# Patient Record
Sex: Male | Born: 1995 | Race: White | Hispanic: No | Marital: Single | Smoking: Never smoker
Health system: Southern US, Community
[De-identification: ages and names within clinical notes are randomized; demographics above are authoritative.]

---
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
## 2014-01-13 ENCOUNTER — Encounter: Payer: Self-pay | Admitting: Sports Medicine

## 2014-01-13 ENCOUNTER — Ambulatory Visit (INDEPENDENT_AMBULATORY_CARE_PROVIDER_SITE_OTHER): Payer: BC Managed Care – PPO | Admitting: Sports Medicine

## 2014-01-13 VITALS — BP 123/71 | HR 71 | Ht 71.5 in | Wt 145.0 lb

## 2014-01-13 DIAGNOSIS — L700 Acne vulgaris: Secondary | ICD-10-CM

## 2014-01-13 DIAGNOSIS — Z23 Encounter for immunization: Secondary | ICD-10-CM

## 2014-01-13 DIAGNOSIS — W57XXXA Bitten or stung by nonvenomous insect and other nonvenomous arthropods, initial encounter: Secondary | ICD-10-CM | POA: Insufficient documentation

## 2014-01-13 DIAGNOSIS — Z0000 Encounter for general adult medical examination without abnormal findings: Secondary | ICD-10-CM

## 2014-01-13 DIAGNOSIS — Z299 Encounter for prophylactic measures, unspecified: Secondary | ICD-10-CM | POA: Insufficient documentation

## 2014-01-13 DIAGNOSIS — L708 Other acne: Secondary | ICD-10-CM

## 2014-01-13 DIAGNOSIS — R41840 Attention and concentration deficit: Secondary | ICD-10-CM

## 2014-01-13 NOTE — Assessment & Plan Note (Signed)
ADHD IV rating scale given. He will also check with [employer] to see if they have an in-house evaluation. If not, I am happy to do a referral to Dr. Dellia CloudGutterman for consideration of ADHD testing. 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

## 2014-01-13 NOTE — Progress Notes (Signed)
  Subjective:    CC: Complete physical  HPI:  Mike Christensen is a healthy 18 year old male.  Acne: Well controlled with minocycline.  Poor concentration: we discussed this at prior visits, he is now thinking more strongly about seeing a psychologist for consideration of ADHD testing.  Tick bite: in the room he felt something crawling on his neck, on further inspection there was a tick, non-engorged, on his posterior neck. No rash.  Past medical history, Surgical history, Family history not pertinant except as noted below, Social history, Allergies, and medications have been entered into the medical record, reviewed, and no changes needed.   Review of Systems: No headache, visual changes, nausea, vomiting, diarrhea, constipation, dizziness, abdominal pain, skin rash, fevers, chills, night sweats, swollen lymph nodes, weight loss, chest pain, body aches, joint swelling, muscle aches, shortness of breath, mood changes, visual or auditory hallucinations.  Objective:    General: Well Developed, well nourished, and in no acute distress.  Neuro: Alert and oriented x3, extra-ocular muscles intact, sensation grossly intact.  HEENT: Normocephalic, atraumatic, pupils equal round reactive to light, neck supple, no masses, no lymphadenopathy, thyroid nonpalpable.  Skin: Warm and dry, no rashes noted. A small deer tick was noted on his neck, this was removed with forceps including the head. Cardiac: Regular rate and rhythm, no murmurs rubs or gallops.  Respiratory: Clear to auscultation bilaterally. Not using accessory muscles, speaking in full sentences.  Abdominal: Soft, nontender, nondistended, positive bowel sounds, no masses, no organomegaly.  Musculoskeletal: Shoulder, elbow, wrist, hip, knee, ankle stable, and with full range of motion.  Impression and Recommendations:    The patient was counselled, risk factors were discussed, anticipatory guidance given.

## 2014-01-13 NOTE — Assessment & Plan Note (Signed)
Overall well controlled with minocycline, I did tell him we can alternate minocycline and doxycycline as needed.

## 2014-01-13 NOTE — Assessment & Plan Note (Signed)
Complete physical as above. Meningococcal booster given.

## 2014-01-13 NOTE — Assessment & Plan Note (Addendum)
Small deer tick noted on his neck, it was not engorged, was removed with forceps including head. He was asymptomatic, and tick was likely not attached long enough to consider tick borne serologies.

## 2014-02-27 ENCOUNTER — Telehealth: Payer: Self-pay

## 2014-02-27 NOTE — Telephone Encounter (Signed)
Spoke to patient he stated that he is on the Minocycline now and it worked for 2 weeks and it is not working he stated that it is worst on his face and back. He wants something topical. Patient is in college now so his mom will call back with the pharmacy information. Rhonda Cunningham,CMA

## 2014-02-27 NOTE — Telephone Encounter (Signed)
Mother called stated that acne medication is not working for patient she is requesting something else. Rhonda Cunningham,CMA

## 2014-02-27 NOTE — Telephone Encounter (Signed)
Awaiting call back with pharmacy information. We will probably use tretinoin

## 2014-02-27 NOTE — Telephone Encounter (Signed)
Minocycline was effective before, does he want something else topical or something oral, where as the acne the worst, face, chest, back?

## 2014-03-02 NOTE — Telephone Encounter (Signed)
Called patient to see which pharmacy he wanted to use and patient is still unsure as to what pharmacy he wants to use. Mike Christensen,CMA

## 2014-03-03 NOTE — Telephone Encounter (Signed)
Called patient again today to find out which pharmacy he wanted his medication sent to still unable to reach patient. Maree Ainley,CMA

## 2014-07-22 ENCOUNTER — Encounter: Payer: Self-pay | Admitting: Sports Medicine

## 2014-07-22 ENCOUNTER — Ambulatory Visit (INDEPENDENT_AMBULATORY_CARE_PROVIDER_SITE_OTHER): Payer: BLUE CROSS/BLUE SHIELD | Admitting: Sports Medicine

## 2014-07-22 VITALS — BP 110/68 | HR 60 | Ht 71.0 in | Wt 149.0 lb

## 2014-07-22 DIAGNOSIS — IMO0001 Reserved for inherently not codable concepts without codable children: Secondary | ICD-10-CM

## 2014-07-22 DIAGNOSIS — S93331A Other subluxation of right foot, initial encounter: Secondary | ICD-10-CM | POA: Insufficient documentation

## 2014-07-22 DIAGNOSIS — S9304XA Dislocation of right ankle joint, initial encounter: Secondary | ICD-10-CM | POA: Diagnosis not present

## 2014-07-22 NOTE — Progress Notes (Signed)
  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 He had immediate pain, swelling, bruising, this improved however he continued to have a popping sensation that he localizes over the lateral aspect of his ankle. He also had pain that he localizes over the lateral talocrural joint. Currently he's doing well however with any active or passive plantar flexion and eversion he notes a pop and pain area symptoms are mild, persistent. [facility], they obtained x-rays which were negative. He does wear a brace while wearing his hockey skate, this helps somewhat.  Past medical history, Surgical history, Family history not pertinant except as noted below, Social history, Allergies, and medications have been entered into the medical record, reviewed, and no changes needed.   Review of Systems: No fevers, chills, night sweats, weight loss, chest pain, or shortness of breath.   Objective:    General: Well Developed, well nourished, and in no acute distress.  Neuro: Alert and oriented x3, extra-ocular muscles intact, sensation grossly intact.  HEENT: Normocephalic, atraumatic, pupils equal round reactive to light, neck supple, no masses, no lymphadenopathy, thyroid nonpalpable.  Skin: Warm and dry, no rashes. Cardiac: Regular rate and rhythm, no murmurs rubs or gallops, no lower extremity edema.  Respiratory: Clear to auscultation bilaterally. Not using accessory muscles, speaking in full sentences. Right Ankle: No visible erythema or swelling. Range of motion is full in all directions. Strength is 5/5 in all directions. Stable lateral and medial ligaments; squeeze test and kleiger test unremarkable; Minimally tender to palpation at the lateral talar dome. No pain at base of 5th MT; No tenderness over cuboid; No tenderness over N spot or navicular prominence No  tenderness on posterior aspects of lateral and medial malleolus Plantarflexion and eversion does reproduce peroneal tendon subluxation. Negative tarsal tunnel tinel's Able to walk 4 steps.  Impression and Recommendations:

## 2014-07-22 NOTE — Patient Instructions (Signed)
Peroneal Tendon Subluxation and Dislocation with Rehab The two peroneal tendons are located on the outer side of the back of the ankle, and connect the muscles that allow you to stand on your tip toes. These tendons are susceptible to two similar injuries known as a dislocation or a subluxation. A dislocation occurs when one or both of the tendons is out of alignment and slips completely out of the groove where they normally rest. A subluxation is a lesser but similar injury in which one or both of the tendons slide in and out of normal alignment. The tendons are normally held into place by a ligament-like structure (retinaculum); however, when this structure is injured dislocations and subluxations are common. SYMPTOMS   A "pop" or "snap" heard or felt at the time of injury.  Pain, swelling, tenderness, and bruising (contusion) at the injury site, behind the outer ankle, that initially is worsened by standing or walking.  Pain and weakness when trying to push the foot toward the injured side.  Often no problems (asymptomatic) when walking, but symptomatic when trying to cut or pivot on the injured leg, moving toward the side of injury.  A crackling sound (crepitation ) when the tendon is moved or touched.  Numbness or paralysis below the dislocation from pinching, cutting, or pressure on the blood vessels or nerves (uncommon). CAUSES  Peroneal tendon dislocations and subluxations are caused by a force placed on the tendon(s) that is strong enough to displace one or both of them from their normal alignment. Common mechanisms of injury include:  A sudden stressful incident, such as forceful bend to an ankle (most often outwardly turned) against resistance by the peroneal muscles.  Severe ankle sprain.  You are born with it (congenital abnormality), such as a shallow or malformed groove for the tendons. RISK INCREASES WITH:  Snow skiing or ice skating.  Jumping sports (basketball,  gymnastics, or volleyball).  Sports in which pivoting is important (football, soccer, or lacrosse).  Exercise on uneven terrain or surfaces.  Previous foot or ankle injury.  Poor strength and flexibility. PREVENTION  Warm up and stretch properly before activity.  Maintain physical fitness:  Strength, flexibility, and endurance.  Cardiovascular fitness.  Protect the vulnerable ankle with supportive devices, such as wrapped elastic bandages, tape, braces, or high-top athletic shoes.  Avoid irregular surfaces for running or other activities.  Complete the full rehabilitation course after an ankle injury before return to practice or competition. PROGNOSIS  If treated properly, then peroneal dislocations and subluxations can be expected to heal. RELATED COMPLICATIONS   Chronic pain and disability and recurrent dislocation if activity is resumed too soon.  Rupture of the tendon due to recurrent subluxation or dislocation.  Unstable or arthritic joint following repeated injury or delayed treatment. TREATMENT   Acute dislocations and subluxations: Treatment initially involves the use of ice and medication to help reduce pain and inflammation. The use of compression bandages and elevating the foot above the level of the heart will also help reduce inflammation. A controversy exists about whether to treat these injuries with surgery or non-surgical (conservative) measures. The conservative method is to immobilize the foot and ankle for up to 6 weeks. Surgical treatment requires early intervention to repair the injured retinaculum, followed by immobilization. After immobilization it is important to perform strengthening and stretching exercises to help regain strength and a full range of motion. These exercises may be completed at home or with a therapist.  Chronic dislocations and subluxations: Treatment initially  involves the use of ice and medication to help reduce pain and inflammation.  Surgery is recommended to hold the peroneal tendons in the groove where they are suppose to be located. Surgery is followed by immobilization of the foot and ankle. After immobilization it is important to perform strengthening and stretching exercises to help regain strength and a full range of motion. These exercises may be completed at home or with a therapist. MEDICATION   If pain medication is necessary, then nonsteroidal anti-inflammatory medications, such as aspirin and ibuprofen, or other minor pain relievers, such as acetaminophen, are often recommended.  Do not take pain medication for 7 days before surgery.  Prescription pain relievers may be given if deemed necessary by your caregiver. Use only as directed and only as much as you need. COLD THERAPY  Cold treatment (icing) relieves pain and reduces inflammation. Cold treatment should be applied for 10 to 15 minutes every 2 to 3 hours for inflammation and pain and immediately after any activity that aggravates your symptoms. Use ice packs or massage the area with a piece of ice (ice massage). SEEK MEDICAL CARE IF:   Pain, tenderness, or swelling worsens despite treatment.  You experience pain, numbness, or coldness or a blue, gray or dark color appears in the toenails.  Any of the following occur after surgery: signs of infection, including fever, increased pain, swelling, redness, drainage, or bleeding in the surgical area.  New, unexplained symptoms develop (drugs used in treatment may produce side effects). ExitCare Patient Information 2015 [place]. This information is not intended to replace advice given to you by your health care provider. Make sure you discuss any questions you have with your health care provider.

## 2014-07-22 NOTE — Assessment & Plan Note (Signed)
This occurred after an inversion injury several months ago. Mike Christensen continues to have pain with passive and active plantar flexion and eversion of the foot with a palpable pop behind the lateral malleolus. There is also some pain at the lateral talar. He was seen by student health where x-rays were shown to be negative. Unfortunately pain is continuous. CAM boot, [facility]. I am going to allow him to play hockey as long as he keeps a brace on and picks up his foot during slap shots. Return to see me in one month, MRI if no better.

## 2015-01-11 ENCOUNTER — Telehealth: Payer: Self-pay

## 2015-01-11 NOTE — Telephone Encounter (Signed)
Yeah he just needs another one.  Paperwork has to be done etc..

## 2015-01-11 NOTE — Telephone Encounter (Signed)
Pt's mother called stating that Shaquill's boot cracked and she wants to know how to go about exchanging it or getting another one. She says that she contacted [facility] and they told her to take it up with the doctor. Please advise.

## 2015-01-12 ENCOUNTER — Ambulatory Visit: Payer: BLUE CROSS/BLUE SHIELD | Admitting: Sports Medicine

## 2015-01-12 VITALS — BP 130/80 | HR 62

## 2015-01-12 DIAGNOSIS — Z111 Encounter for screening for respiratory tuberculosis: Secondary | ICD-10-CM | POA: Insufficient documentation

## 2015-01-12 NOTE — Assessment & Plan Note (Signed)
Quantiferon Gold TB screening test. This will take place of a PPD.

## 2015-01-12 NOTE — Progress Notes (Signed)
Patient came into clinic for PPD placement. Pt works at an [employer] office and they require it. Prior to administration of PPD test, it was determined the Pt would not be able to come back for his PPD read in the appropriate time frame. Spoke with Dr. Benjamin Stainhekkekandam, blood test was ordered. Pt was given his lab work papers and went down to get his blood drawn. Advised we will contact him with his results and he can come by and get a printed copy for his employer. Verbalized understanding, no further questions.

## 2015-01-12 NOTE — Telephone Encounter (Signed)
Pt's mother advised. 

## 2015-02-10 ENCOUNTER — Telehealth: Payer: Self-pay

## 2015-02-10 DIAGNOSIS — R41840 Attention and concentration deficit: Secondary | ICD-10-CM

## 2015-02-10 NOTE — Telephone Encounter (Signed)
Referral placed to Dr. Dellia Cloud.

## 2015-02-10 NOTE — Telephone Encounter (Signed)
Patient called requested a referral to psychiatrist to be tested for ADD or ADHD patient stated that he has received a hard copy in the past but he does not know what he did with it. [name] [name],CMA

## 2015-02-11 ENCOUNTER — Encounter: Payer: BLUE CROSS/BLUE SHIELD | Admitting: Sports Medicine

## 2015-02-11 NOTE — Telephone Encounter (Signed)
PATIENT HAS BEEN INFORMED AND HE WILL BE IN Friday FOR HIS APPT SO HE WILL PICK IT UP THEN. Scotland Korver,CMA

## 2015-02-12 ENCOUNTER — Ambulatory Visit (INDEPENDENT_AMBULATORY_CARE_PROVIDER_SITE_OTHER): Payer: BLUE CROSS/BLUE SHIELD | Admitting: Family Medicine

## 2015-02-12 ENCOUNTER — Encounter: Payer: Self-pay | Admitting: Family Medicine

## 2015-02-12 VITALS — BP 137/85 | HR 65 | Ht 71.0 in | Wt 156.0 lb

## 2015-02-12 DIAGNOSIS — Z23 Encounter for immunization: Secondary | ICD-10-CM

## 2015-02-12 DIAGNOSIS — Z114 Encounter for screening for human immunodeficiency virus [HIV]: Secondary | ICD-10-CM

## 2015-02-12 DIAGNOSIS — Z0000 Encounter for general adult medical examination without abnormal findings: Secondary | ICD-10-CM

## 2015-02-12 DIAGNOSIS — IMO0002 Reserved for concepts with insufficient information to code with codable children: Secondary | ICD-10-CM

## 2015-02-12 DIAGNOSIS — R41840 Attention and concentration deficit: Secondary | ICD-10-CM

## 2015-02-12 LAB — COMPLETE METABOLIC PANEL WITH GFR
ALT: 14 U/L (ref 9–46)
AST: 20 U/L (ref 10–40)
Albumin: 4.6 g/dL (ref 3.6–5.1)
Alkaline Phosphatase: 57 U/L (ref 40–115)
BILIRUBIN TOTAL: 0.9 mg/dL (ref 0.2–1.2)
BUN: 16 mg/dL (ref 7–25)
CALCIUM: 9.5 mg/dL (ref 8.6–10.3)
CHLORIDE: 105 mmol/L (ref 98–110)
CO2: 24 mmol/L (ref 20–31)
CREATININE: 0.82 mg/dL (ref 0.60–1.35)
GFR, Est African American: >89 mL/min (ref >=60)
Glucose, Bld: 81 mg/dL (ref 65–99)
POTASSIUM: 4.2 mmol/L (ref 3.5–5.3)
Sodium: 138 mmol/L (ref 135–146)
Total Protein: 7.2 g/dL (ref 6.1–8.1)

## 2015-02-12 LAB — LIPID PANEL
CHOLESTEROL: 155 mg/dL (ref 125–170)
HDL: 48 mg/dL (ref >=40)
LDL Cholesterol: 97 mg/dL (ref <110)
Total CHOL/HDL Ratio: 3.2 Ratio (ref <=5.0)
Triglycerides: 50 mg/dL (ref <150)
VLDL: 10 mg/dL (ref <30)

## 2015-02-12 LAB — HIV ANTIBODY (ROUTINE TESTING W REFLEX): HIV 1&2 Ab, 4th Generation: NONREACTIVE

## 2015-02-12 NOTE — Progress Notes (Signed)
Mike Christensen is a 19 y.o. male who presents to [facility]  today for wellness visit. Patient is a Nurse, mental health with biology and chemistry. He intends to go to dental school when he finishes college.  He feels well with no real complaints today. He notes that he sleeps and is trying to quit by using nicotine replacement gum. He says this is going well. Additionally he notes difficulty concentrating that has been ongoing in the past. He would like referral to adult psychiatry for ADHD testing. In the past he has filled out questionnaires and screen positive. He has lost the referrals in the past.  Patient notes that he would like hepatitis C and HIV testing. 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 He has been immunized against hepatitis B successfully.  No past medical history on file. No past surgical history on file. History  Substance Use Topics  . Smoking status: Never Smoker   . Smokeless tobacco: Never Used  . Alcohol Use: No   ROS as above Medications: No current outpatient prescriptions on file.   No current facility-administered medications for this visit.   No Known Allergies   Exam:  BP 137/85 mmHg  Pulse 65  Ht  (1.803 m)  Wt 156 lb (70.761 kg)  BMI 21.77 kg/m2 Gen: Well NAD HEENT: EOMI,  MMM Lungs: Normal work of breathing. CTABL Heart: RRR no MRG Abd: NABS, Soft. Nondistended, Nontender Exts: Brisk capillary refill, warm and well perfused.   No results found for this or any previous visit (from the past 24 hour(s)). No results found.   Please see individual assessment and plan sections.

## 2015-02-12 NOTE — Patient Instructions (Addendum)
Thank you for coming in today. Work on quitting nicotine.  We will refer to psychiatry for ADHD.  You should hear from them soon.  Return in a few months for blood pressure recheck with the nurse.   Attention Deficit Hyperactivity Disorder Attention deficit hyperactivity disorder (ADHD) is a problem with behavior issues based on the way the brain functions (neurobehavioral disorder). It is a common reason for behavior and academic problems in school. SYMPTOMS  There are 3 types of ADHD. The 3 types and some of the symptoms include:  Inattentive.  Gets bored or distracted easily.  Loses or forgets things. Forgets to hand in homework.  Has trouble organizing or completing tasks.  Difficulty staying on task.  An inability to organize daily tasks and school work.  Leaving projects, chores, or homework unfinished.  Trouble paying attention or responding to details. Careless mistakes.  Difficulty following directions. Often seems like is not listening.  Dislikes activities that require sustained attention (like chores or homework).  Hyperactive-impulsive.  Feels like it is impossible to sit still or stay in a seat. Fidgeting with hands and feet.  Trouble waiting turn.  Talking too much or out of turn. Interruptive.  Speaks or acts impulsively.  Aggressive, disruptive behavior.  Constantly busy or on the go; noisy.  Often leaves seat when they are expected to remain seated.  Often runs or climbs where it is not appropriate, or feels very restless.  Combined.  Has symptoms of both of the above. Often children with ADHD feel discouraged about themselves and with school. They often perform well below their abilities in school. As children get older, the excess motor activities can calm down, but the problems with paying attention and staying organized persist. Most children do not outgrow ADHD but with good treatment can learn to cope with the symptoms. DIAGNOSIS  When  ADHD is suspected, the diagnosis should be made by professionals trained in ADHD. This professional will collect information about the individual suspected of having ADHD. Information must be collected from various settings where the person lives, works, or attends school.  Diagnosis will include:  Confirming symptoms began in childhood.  Ruling out other reasons for the child's behavior.  The health care providers will check with the child's school and check their medical records.  They will talk to teachers and parents.  Behavior rating scales for the child will be filled out by those dealing with the child on a daily basis. A diagnosis is made only after all information has been considered. TREATMENT  Treatment usually includes behavioral treatment, tutoring or extra support in school, and stimulant medicines. Because of the way a person's brain works with ADHD, these medicines decrease impulsivity and hyperactivity and increase attention. This is different than how they would work in a person who does not have ADHD. Other medicines used include antidepressants and certain blood pressure medicines. Most experts agree that treatment for ADHD should address all aspects of the person's functioning. Along with medicines, treatment should include structured classroom management at school. Parents should reward good behavior, provide constant discipline, and set limits. Tutoring should be available for the child as needed. ADHD is a lifelong condition. If untreated, the disorder can have long-term serious effects into adolescence and adulthood. HOME CARE INSTRUCTIONS   Often with ADHD there is a lot of frustration among family members dealing with the condition. Blame and anger are also feelings that are common. In many cases, because the problem affects the family as a  whole, the entire family may need help. A therapist can help the family find better ways to handle the disruptive behaviors of the  person with ADHD and promote change. If the person with ADHD is young, most of the therapist's work is with the parents. Parents will learn techniques for coping with and improving their child's behavior. Sometimes only the child with the ADHD needs counseling. Your health care providers can help you make these decisions.  Children with ADHD may need help learning how to organize. Some helpful tips include:  Keep routines the same every day from wake-up time to bedtime. Schedule all activities, including homework and playtime. Keep the schedule in a place where the person with ADHD will often see it. Mark schedule changes as far in advance as possible.  Schedule outdoor and indoor recreation.  Have a place for everything and keep everything in its place. This includes clothing, backpacks, and school supplies.  Encourage writing down assignments and bringing home needed books. Work with your child's teachers for assistance in organizing school work.  Offer your child a well-balanced diet. Breakfast that includes a balance of whole grains, protein, and fruits or vegetables is especially important for school performance. Children should avoid drinks with caffeine including:  Soft drinks.  Coffee.  Tea.  However, some older children (adolescents) may find these drinks helpful in improving their attention. Because it can also be common for adolescents with ADHD to become addicted to caffeine, talk with your health care provider about what is a safe amount of caffeine intake for your child.  Children with ADHD need consistent rules that they can understand and follow. If rules are followed, give small rewards. Children with ADHD often receive, and expect, criticism. Look for good behavior and praise it. Set realistic goals. Give clear instructions. Look for activities that can foster success and self-esteem. Make time for pleasant activities with your child. Give lots of affection.  Parents are  their children's greatest advocates. Learn as much as possible about ADHD. This helps you become a stronger and better advocate for your child. It also helps you educate your child's teachers and instructors if they feel inadequate in these areas. Parent support groups are often helpful. A national group with local chapters is called Children and Adults with Attention Deficit Hyperactivity Disorder (CHADD). SEEK MEDICAL CARE IF:  Your child has repeated muscle twitches, cough, or speech outbursts.  Your child has sleep problems.  Your child has a marked loss of appetite.  Your child develops depression.  Your child has new or worsening behavioral problems.  Your child develops dizziness.  Your child has a racing heart.  Your child has stomach pains.  Your child develops headaches. SEEK IMMEDIATE MEDICAL CARE IF:  Your child has been diagnosed with depression or anxiety and the symptoms seem to be getting worse.  Your child has been depressed and suddenly appears to have increased energy or motivation.  You are worried that your child is having a bad reaction to a medication he or she is taking for ADHD. Document Released: 06/23/2002 Document Revised: 07/08/2013 Document Reviewed: 03/10/2013 [facility] Patient Information 2015 [place], [place]. This information is not intended to replace advice given to you by your health care provider. Make sure you discuss any questions you have with your health care provider.

## 2015-02-12 NOTE — Assessment & Plan Note (Addendum)
Doing well. Recheck blood pressure in 6-12 months. Continue smoking cessation. Refer to adult psychiatry for ADHD testing. Screening labs including HIV hepatitis C CMP and lipid panel are pending

## 2015-02-13 LAB — HEPATITIS C ANTIBODY: HCV Ab: NEGATIVE

## 2015-02-15 NOTE — Progress Notes (Signed)
Quick Note:  Normal, no changes. ______ 

## 2015-07-28 ENCOUNTER — Telehealth: Payer: Self-pay

## 2015-07-28 NOTE — Telephone Encounter (Signed)
Pt left a voicemail stating he has recently been exposed to a biohazardous material and would like to know w

## 2015-07-28 NOTE — Telephone Encounter (Signed)
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 The instrument contained cancerous cells (hele cells). After injury rinsed for 15 minutes and cleaned with  70% ethanol alcohol. 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

## 2015-07-28 NOTE — Telephone Encounter (Signed)
Need details

## 2015-07-29 NOTE — Telephone Encounter (Signed)
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  At this point we simply need to keep an eye on the cut to make sure that it doesn't develop into anything else, and he should come in to start the vaccination series if he has not already had Gardasil.  Vaccination will be given now, then one month later, and then 6 months later. He can do this in a nursing visit. The precise survival time on instruments outside the body is unknown.

## 2015-07-29 NOTE — Telephone Encounter (Signed)
Unable to call patient, gives an unable to connect message.

## 2015-07-30 NOTE — Telephone Encounter (Signed)
Pt notified. Will consider Gardasil injections at a later time. No questions or concerns at this time.

## 2015-09-27 ENCOUNTER — Encounter: Payer: Self-pay | Admitting: Sports Medicine

## 2015-09-27 ENCOUNTER — Ambulatory Visit (INDEPENDENT_AMBULATORY_CARE_PROVIDER_SITE_OTHER): Payer: BLUE CROSS/BLUE SHIELD | Admitting: Sports Medicine

## 2015-09-27 VITALS — BP 132/77 | HR 57 | Temp 98.0°F | Resp 18 | Wt 154.7 lb

## 2015-09-27 DIAGNOSIS — J0100 Acute maxillary sinusitis, unspecified: Secondary | ICD-10-CM

## 2015-09-27 MED ORDER — FLUTICASONE PROPIONATE 50 MCG/ACT NA SUSP: NASAL | Status: DC

## 2015-09-27 MED ORDER — AZITHROMYCIN 250 MG PO TABS: ORAL_TABLET | ORAL | Status: DC

## 2015-09-27 NOTE — Progress Notes (Signed)
  Subjective:    CC:  Upper respiratory symptoms  HPI: For the past week and a half this pleasant 20 year old male has had rhinorrhea, with stuffiness and pressure over his maxillary sinuses, his symptoms resolved and then came back worse than before, with pain and pressure over the maxillary sinuses with radiation to the throat. GI symptoms, no rash, no stiff neck. Low-grade fevers and chills.  Past medical history, Surgical history, Family history not pertinant except as noted below, Social history, Allergies, and medications have been entered into the medical record, reviewed, and no changes needed.   Review of Systems: No fevers, chills, night sweats, weight loss, chest pain, or shortness of breath.   Objective:    General: Well Developed, well nourished, and in no acute distress.  Neuro: Alert and oriented x3, extra-ocular muscles intact, sensation grossly intact.  HEENT: Normocephalic, atraumatic, pupils equal round reactive to light, neck supple, no masses, no lymphadenopathy, thyroid nonpalpable.  Oropharynx, nasopharynx, ear canals unremarkable. Skin: Warm and dry, no rashes. Cardiac: Regular rate and rhythm, no murmurs rubs or gallops, no lower extremity edema.  Respiratory: Clear to auscultation bilaterally. Not using accessory muscles, speaking in full sentences.  Impression and Recommendations:    I spent 25 minutes with this patient, greater than 50% was face-to-face time counseling regarding the above diagnoses

## 2015-09-27 NOTE — Assessment & Plan Note (Signed)
Azithromycin, Flonase 

## 2016-01-13 ENCOUNTER — Ambulatory Visit (INDEPENDENT_AMBULATORY_CARE_PROVIDER_SITE_OTHER): Payer: BLUE CROSS/BLUE SHIELD | Admitting: Sports Medicine

## 2016-01-13 ENCOUNTER — Encounter: Payer: Self-pay | Admitting: Sports Medicine

## 2016-01-13 VITALS — BP 130/69 | HR 80 | Wt 174.0 lb

## 2016-01-13 DIAGNOSIS — M25511 Pain in right shoulder: Secondary | ICD-10-CM | POA: Diagnosis not present

## 2016-01-13 NOTE — Progress Notes (Signed)
  Subjective:    CC: Right shoulder injury  HPI: Several days ago well lifting weights and doing the chest depressed this pleasant 20 year old male felt a pop in his shoulder, he had immediate pain and inability to abductor shoulder, since then he's gotten better relatively rapidly, and only has moderate pain over the deltoid. No radiation, worse with overhead activities, no trauma, no mechanical symptoms.  Past medical history, Surgical history, Family history not pertinant except as noted below, Social history, Allergies, and medications have been entered into the medical record, reviewed, and no changes needed.   Review of Systems: No fevers, chills, night sweats, weight loss, chest pain, or shortness of breath.   Objective:    General: Well Developed, well nourished, and in no acute distress.  Neuro: Alert and oriented x3, extra-ocular muscles intact, sensation grossly intact.  HEENT: Normocephalic, atraumatic, pupils equal round reactive to light, neck supple, no masses, no lymphadenopathy, thyroid nonpalpable.  Skin: Warm and dry, no rashes. Cardiac: Regular rate and rhythm, no murmurs rubs or gallops, no lower extremity edema.  Respiratory: Clear to auscultation bilaterally. Not using accessory muscles, speaking in full sentences. Right Shoulder: Inspection reveals no abnormalities, atrophy or asymmetry. Palpation is normal with no tenderness over AC joint or bicipital groove. ROM is full in all planes. Rotator cuff strength normal throughout. Positive Hawkins test, negative Neer's test and empty can signs Speeds and Yergason's tests normal. No labral pathology noted with negative crank, negative clunk, and good stability. Positive O'Brien's test. Normal scapular function observed. No painful arc and no drop arm sign. No apprehension sign  Impression and Recommendations:

## 2016-01-13 NOTE — Assessment & Plan Note (Signed)
Likely represents a rotator cuff strain. He did have one sign of labral injury but I don't think that this is the primary issue. We will start with home exercise bands, continue naproxen. Return in 4-6 weeks.

## 2016-01-14 ENCOUNTER — Ambulatory Visit: Payer: BLUE CROSS/BLUE SHIELD | Admitting: Sports Medicine

## 2016-09-19 ENCOUNTER — Encounter: Payer: Self-pay | Admitting: Sports Medicine

## 2016-09-19 ENCOUNTER — Ambulatory Visit (INDEPENDENT_AMBULATORY_CARE_PROVIDER_SITE_OTHER): Payer: BLUE CROSS/BLUE SHIELD | Admitting: Sports Medicine

## 2016-09-19 ENCOUNTER — Ambulatory Visit (INDEPENDENT_AMBULATORY_CARE_PROVIDER_SITE_OTHER): Payer: BLUE CROSS/BLUE SHIELD

## 2016-09-19 DIAGNOSIS — M25511 Pain in right shoulder: Secondary | ICD-10-CM

## 2016-09-19 DIAGNOSIS — G8929 Other chronic pain: Secondary | ICD-10-CM | POA: Diagnosis not present

## 2016-09-19 NOTE — Assessment & Plan Note (Signed)
Injury occurred about 9 months ago, persistent pain, and mechanical symptoms. He failed formal physical therapy. Exam is fairly benign today but in the past he did have signs of a labral injury. He does have 1+ translational anterior instability on exam, consistent with unidirectional or multidirectional instability. X-rays today,ordering an MRI arthrogram.

## 2016-09-19 NOTE — Progress Notes (Signed)
  Subjective:    CC: Follow-up  HPI: Mike Christensen returns, he continues to have pain in his right shoulder, with a sensation of instability and mechanical symptoms, he had an injury about 9 months ago. He did well initially after physical therapy but never stopped having pain or mechanical symptoms.  Past medical history:  Negative.  See flowsheet/record as well for more information.  Surgical history: Negative.  See flowsheet/record as well for more information.  Family history: Negative.  See flowsheet/record as well for more information.  Social history: Negative.  See flowsheet/record as well for more information.  Allergies, and medications have been entered into the medical record, reviewed, and no changes needed.   Review of Systems: No fevers, chills, night sweats, weight loss, chest pain, or shortness of breath.   Objective:    General: Well Developed, well nourished, and in no acute distress.  Neuro: Alert and oriented x3, extra-ocular muscles intact, sensation grossly intact.  HEENT: Normocephalic, atraumatic, pupils equal round reactive to light, neck supple, no masses, no lymphadenopathy, thyroid nonpalpable.  Skin: Warm and dry, no rashes. Cardiac: Regular rate and rhythm, no murmurs rubs or gallops, no lower extremity edema.  Respiratory: Clear to auscultation bilaterally. Not using accessory muscles, speaking in full sentences. Right Shoulder: Inspection reveals no abnormalities, atrophy or asymmetry. Palpation is normal with no tenderness over AC joint or bicipital groove. ROM is full in all planes. Rotator cuff strength normal throughout. No signs of impingement with negative Neer and Hawkin's tests, empty can. Speeds and Yergason's tests normal. No labral pathology noted with negative Obrien's, negative crank, negative clunk, and 1+ anterior translational instability Normal scapular function observed. No painful arc and no drop arm sign. No apprehension  sign  Impression and Recommendations:    Right shoulder pain Injury occurred about 9 months ago, persistent pain, and mechanical symptoms. He failed formal physical therapy. Exam is fairly benign today but in the past he did have signs of a labral injury. He does have 1+ translational anterior instability on exam, consistent with unidirectional or multidirectional instability. X-rays today,ordering an MRI arthrogram.  I spent 25 minutes with this patient, greater than 50% was face-to-face time counseling regarding the above diagnoses

## 2016-09-23 ENCOUNTER — Ambulatory Visit (HOSPITAL_BASED_OUTPATIENT_CLINIC_OR_DEPARTMENT_OTHER)
Admission: RE | Admit: 2016-09-23 | Discharge: 2016-09-23 | Disposition: A | Discharge status: Home / Self Care | Payer: BLUE CROSS/BLUE SHIELD | Source: Ambulatory Visit | Attending: Sports Medicine | Admitting: Sports Medicine

## 2016-09-23 DIAGNOSIS — M67813 Other specified disorders of tendon, right shoulder: Secondary | ICD-10-CM | POA: Diagnosis not present

## 2016-09-23 DIAGNOSIS — M25511 Pain in right shoulder: Secondary | ICD-10-CM | POA: Diagnosis not present

## 2016-09-23 DIAGNOSIS — S43491A Other sprain of right shoulder joint, initial encounter: Secondary | ICD-10-CM | POA: Diagnosis not present

## 2016-09-23 DIAGNOSIS — S41011A Laceration without foreign body of right shoulder, initial encounter: Secondary | ICD-10-CM | POA: Diagnosis not present

## 2016-09-23 DIAGNOSIS — G8929 Other chronic pain: Secondary | ICD-10-CM

## 2016-09-23 NOTE — Procedures (Signed)
Procedure: Real-time Ultrasound Guided gadolinium contrast injection of right glenohumeral joint Device: GE Logiq E  Verbal informed consent obtained.  Time-out conducted.  Noted no overlying erythema, induration, or other signs of local infection.  Skin prepped in a sterile fashion.  Local anesthesia: Topical Ethyl chloride.  With sterile technique and under real time ultrasound guidance:  Advance to 22-gauge spinal needle into the glenohumeral joint taking care to avoid the labrum, I injected 1 mL kenalog 40, 2 mL lidocaine, 2 mL bupivacaine, syringe switched and 0.1 mL gadolinium injected, syringe again switched and 10 mL sterile saline injected in the joint Joint visualized and capsule seen distending confirming intra-articular placement of contrast material and medication. Completed without difficulty  Advised to call if fevers/chills, erythema, induration, drainage, or persistent bleeding.  Images permanently stored and available for review in the ultrasound unit.  Impression: Technically successful ultrasound guided gadolinium contrast injection for MR arthrography.  Please see separate MR arthrogram report.

## 2016-09-25 ENCOUNTER — Other Ambulatory Visit: Payer: Self-pay | Admitting: Sports Medicine

## 2016-09-25 DIAGNOSIS — S43431D Superior glenoid labrum lesion of right shoulder, subsequent encounter: Secondary | ICD-10-CM

## 2016-09-25 NOTE — Progress Notes (Signed)
PT referral to [facility] in [place], [place] [facility]   [address], [place], [place] [address] Phone: [phone]

## 2016-10-03 DIAGNOSIS — S43431S Superior glenoid labrum lesion of right shoulder, sequela: Secondary | ICD-10-CM | POA: Diagnosis not present

## 2016-10-03 DIAGNOSIS — M25311 Other instability, right shoulder: Secondary | ICD-10-CM | POA: Diagnosis not present

## 2016-10-06 DIAGNOSIS — M25311 Other instability, right shoulder: Secondary | ICD-10-CM | POA: Diagnosis not present

## 2016-10-06 DIAGNOSIS — S43431S Superior glenoid labrum lesion of right shoulder, sequela: Secondary | ICD-10-CM | POA: Diagnosis not present

## 2016-10-09 DIAGNOSIS — M25311 Other instability, right shoulder: Secondary | ICD-10-CM | POA: Diagnosis not present

## 2016-10-09 DIAGNOSIS — S43431S Superior glenoid labrum lesion of right shoulder, sequela: Secondary | ICD-10-CM | POA: Diagnosis not present

## 2016-10-12 DIAGNOSIS — M25311 Other instability, right shoulder: Secondary | ICD-10-CM | POA: Diagnosis not present

## 2016-10-12 DIAGNOSIS — S43431S Superior glenoid labrum lesion of right shoulder, sequela: Secondary | ICD-10-CM | POA: Diagnosis not present

## 2016-10-16 DIAGNOSIS — S43431S Superior glenoid labrum lesion of right shoulder, sequela: Secondary | ICD-10-CM | POA: Diagnosis not present

## 2016-10-16 DIAGNOSIS — M25311 Other instability, right shoulder: Secondary | ICD-10-CM | POA: Diagnosis not present

## 2016-10-20 DIAGNOSIS — M25311 Other instability, right shoulder: Secondary | ICD-10-CM | POA: Diagnosis not present

## 2016-10-20 DIAGNOSIS — S43431S Superior glenoid labrum lesion of right shoulder, sequela: Secondary | ICD-10-CM | POA: Diagnosis not present

## 2016-10-23 DIAGNOSIS — S43431S Superior glenoid labrum lesion of right shoulder, sequela: Secondary | ICD-10-CM | POA: Diagnosis not present

## 2016-10-23 DIAGNOSIS — M25311 Other instability, right shoulder: Secondary | ICD-10-CM | POA: Diagnosis not present

## 2016-11-07 DIAGNOSIS — S43431S Superior glenoid labrum lesion of right shoulder, sequela: Secondary | ICD-10-CM | POA: Diagnosis not present

## 2016-11-07 DIAGNOSIS — M25311 Other instability, right shoulder: Secondary | ICD-10-CM | POA: Diagnosis not present

## 2016-12-19 DIAGNOSIS — S6992XA Unspecified injury of left wrist, hand and finger(s), initial encounter: Secondary | ICD-10-CM | POA: Diagnosis not present

## 2017-08-19 DIAGNOSIS — L03012 Cellulitis of left finger: Secondary | ICD-10-CM | POA: Diagnosis not present

## 2017-08-19 DIAGNOSIS — Z23 Encounter for immunization: Secondary | ICD-10-CM | POA: Diagnosis not present

## 2017-08-19 DIAGNOSIS — L02512 Cutaneous abscess of left hand: Secondary | ICD-10-CM | POA: Diagnosis not present

## 2018-01-28 DIAGNOSIS — Z113 Encounter for screening for infections with a predominantly sexual mode of transmission: Secondary | ICD-10-CM | POA: Diagnosis not present

## 2019-01-02 ENCOUNTER — Telehealth: Payer: Self-pay

## 2019-01-02 NOTE — Telephone Encounter (Signed)
Pt's mother called stating that patient is now living in [place] area and he will not go a see a doctor unless it is either Dr T or someone recommended by Dr T...  Do you have a recommendation of a PCP that I can call and advise mother of?

## 2019-01-03 NOTE — Telephone Encounter (Signed)
Try Dr. Conni Slipper (she is in [place])

## 2019-01-06 NOTE — Telephone Encounter (Signed)
Pt's mom advised -

## 2019-03-06 ENCOUNTER — Telehealth: Payer: Self-pay | Admitting: *Deleted

## 2019-03-06 NOTE — Telephone Encounter (Signed)
Pt's mom notified of recommendation.

## 2019-03-06 NOTE — Telephone Encounter (Signed)
Try Dr. Lennie Muckle.  He and I trained at the same program.

## 2019-03-06 NOTE — Telephone Encounter (Signed)
Pt's mom left vm wanting to know if you know of a good sports med doc in the [facility] area for Mike Christensen's shoulder.

## 2019-03-07 ENCOUNTER — Other Ambulatory Visit: Payer: Self-pay | Admitting: Sports Medicine

## 2019-03-07 DIAGNOSIS — G8929 Other chronic pain: Secondary | ICD-10-CM

## 2019-03-11 ENCOUNTER — Encounter: Payer: Self-pay | Admitting: Sports Medicine

## 2019-03-11 ENCOUNTER — Ambulatory Visit (INDEPENDENT_AMBULATORY_CARE_PROVIDER_SITE_OTHER): Payer: BC Managed Care – PPO

## 2019-03-11 ENCOUNTER — Ambulatory Visit (INDEPENDENT_AMBULATORY_CARE_PROVIDER_SITE_OTHER): Payer: BC Managed Care – PPO | Admitting: Sports Medicine

## 2019-03-11 ENCOUNTER — Other Ambulatory Visit: Payer: Self-pay

## 2019-03-11 DIAGNOSIS — J342 Deviated nasal septum: Secondary | ICD-10-CM | POA: Diagnosis not present

## 2019-03-11 DIAGNOSIS — G8929 Other chronic pain: Secondary | ICD-10-CM

## 2019-03-11 DIAGNOSIS — M545 Low back pain, unspecified: Secondary | ICD-10-CM

## 2019-03-11 MED ORDER — CELECOXIB 200 MG PO CAPS: ORAL_CAPSULE | ORAL | 2 refills | Status: DC

## 2019-03-11 NOTE — Assessment & Plan Note (Signed)
Axial discogenic back pain now for greater than 6 weeks in spite of conservative measures. X-rays today, MRI should be scheduled at 4:00. Adding Celebrex, rehab exercises.

## 2019-03-11 NOTE — Addendum Note (Signed)
Addended by: Silverio Decamp on: 03/11/2019 03:14 PM   Modules accepted: Orders

## 2019-03-11 NOTE — Assessment & Plan Note (Signed)
With subjective nasal airway obstruction, referral to ENT, preferably in [place] where he lives.

## 2019-03-11 NOTE — Progress Notes (Addendum)
Subjective:    CC: Low back pain  HPI: This is a pleasant 23 year old male, back in the wintertime, March, he was snowboarding, did a 360 and dislocated his shoulder, also injured his back.  He has had persistent pain, axial, worse with sitting, flexion, Valsalva without radiculopathy, he has tried physician directed rehabilitation, NSAIDs without much improvement.  No bowel or bladder dysfunction, saddle numbness, constitutional symptoms.  I reviewed the past medical history, family history, social history, surgical history, and allergies today and no changes were needed.  Please see the problem list section below in epic for further details.  Past Medical History: No past medical history on file. Past Surgical History: No past surgical history on file. Social History: Social History   Socioeconomic History  . Marital status: Single    Spouse name: Not on file  . Number of children: Not on file  . Years of education: Not on file  . Highest education level: Not on file  Occupational History  . Not on file  Social Needs  . Financial resource strain: Not on file  . Food insecurity    Worry: Not on file    Inability: Not on file  . Transportation needs    Medical: Not on file    Non-medical: Not on file  Tobacco Use  . Smoking status: Never Smoker  . Smokeless tobacco: Never Used  Substance and Sexual Activity  . Alcohol use: No  . Drug use: No  . Sexual activity: Not Currently  Lifestyle  . Physical activity    Days per week: Not on file    Minutes per session: Not on file  . Stress: Not on file  Relationships  . Social Herbalist on phone: Not on file    Gets together: Not on file    Attends religious service: Not on file    Active member of club or organization: Not on file    Attends meetings of clubs or organizations: Not on file    Relationship status: Not on file  Other Topics Concern  . Not on file  Social History Narrative  . Not on file    Family History: Family History  Problem Relation Age of Onset  . Hypertension Maternal Grandmother   . Hypertension Maternal Grandfather   . Cancer Paternal Grandfather        skin   Allergies: No Known Allergies Medications: See med rec.  Review of Systems: No fevers, chills, night sweats, weight loss, chest pain, or shortness of breath.   Objective:    General: Well Developed, well nourished, and in no acute distress.  Neuro: Alert and oriented x3, extra-ocular muscles intact, sensation grossly intact.  HEENT: Normocephalic, atraumatic, pupils equal round reactive to light, neck supple, no masses, no lymphadenopathy, thyroid nonpalpable.  Skin: Warm and dry, no rashes. Cardiac: Regular rate and rhythm, no murmurs rubs or gallops, no lower extremity edema.  Respiratory: Clear to auscultation bilaterally. Not using accessory muscles, speaking in full sentences. Back Exam:  Inspection: Unremarkable  Motion: Flexion 45 deg, Extension 45 deg, Side Bending to 45 deg bilaterally,  Rotation to 45 deg bilaterally  SLR laying: Negative  XSLR laying: Negative  Palpable tenderness: None. FABER: negative. Sensory change: Gross sensation intact to all lumbar and sacral dermatomes.  Reflexes: 2+ at both patellar tendons, 2+ at achilles tendons, Babinski's downgoing.  Strength at foot  Plantar-flexion: 5/5 Dorsi-flexion: 5/5 Eversion: 5/5 Inversion: 5/5  Leg strength  Quad: 5/5 Hamstring: 5/5  Hip flexor: 5/5 Hip abductors: 5/5  Gait unremarkable.  Impression and Recommendations:    Chronic low back pain Axial discogenic back pain now for greater than 6 weeks in spite of conservative measures. X-rays today, MRI should be scheduled at 4:00. Adding Celebrex, rehab exercises.  Deviated septum With subjective nasal airway obstruction, referral to ENT, preferably in [place] where he lives.   ___________________________________________ Ihor Austinhomas J. Benjamin Stainhekkekandam, M.D., ABFM., CAQSM.  Primary Care and Sports Medicine [facility]  Adjunct Professor of Family Medicine  [employer]

## 2019-03-20 DIAGNOSIS — J310 Chronic rhinitis: Secondary | ICD-10-CM | POA: Diagnosis not present

## 2019-03-20 DIAGNOSIS — Z6822 Body mass index (BMI) 22.0-22.9, adult: Secondary | ICD-10-CM | POA: Diagnosis not present

## 2019-03-20 DIAGNOSIS — J342 Deviated nasal septum: Secondary | ICD-10-CM | POA: Diagnosis not present

## 2019-11-11 DIAGNOSIS — Z113 Encounter for screening for infections with a predominantly sexual mode of transmission: Secondary | ICD-10-CM | POA: Diagnosis not present

## 2019-11-23 IMAGING — MR MRI LUMBAR SPINE WITHOUT CONTRAST
4 of 5 series · 27 of 48 positions shown · non-contrast
Comparison: Radiographs dated 03/11/2019

CLINICAL DATA: Low back pain since a ski accident 6 months ago.

EXAM:
MRI LUMBAR SPINE WITHOUT CONTRAST
TECHNIQUE: Multiplanar, multisequence MR imaging of the lumbar spine was
performed. No intravenous contrast was administered.

[Series 3: T2 · sagittal · 4.0mm · 0.81mm/px · 5 of 15 slices shown (1 of 2)]
[im 1/15]
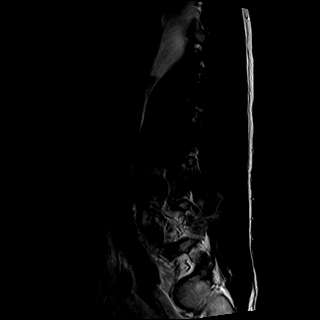
[im 4/15]
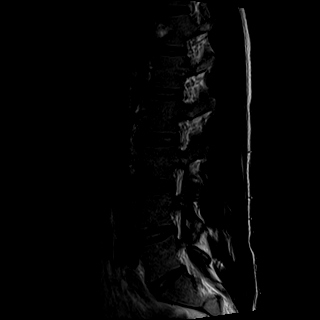
[im 8/15]
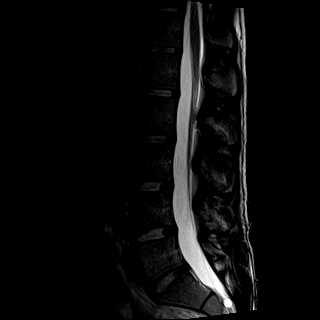
[im 11/15]
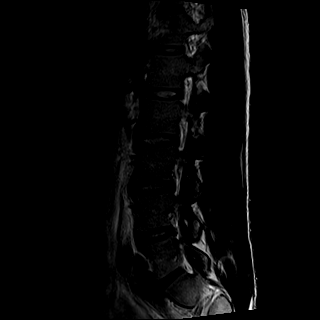
[im 15/15]
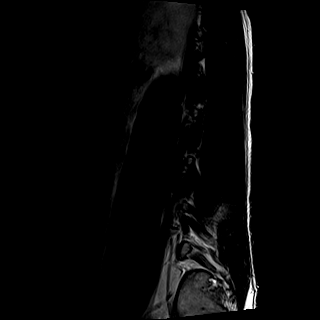

[Series 4: T1 · sagittal · 4.0mm · 0.41mm/px · 5 of 15 slices shown (1 of 2)]
[im 1/15]
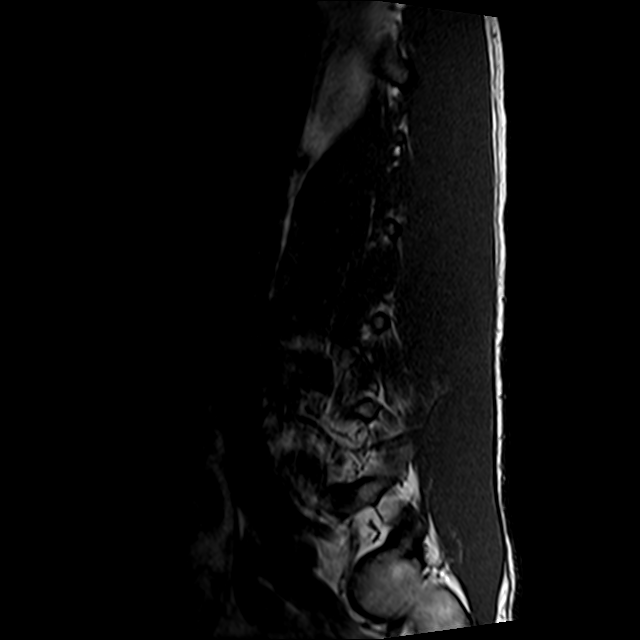
[im 4/15]
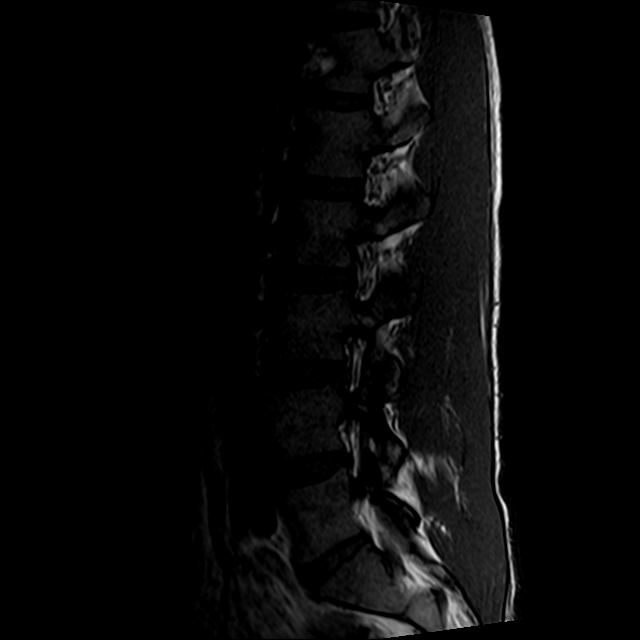
[im 8/15]
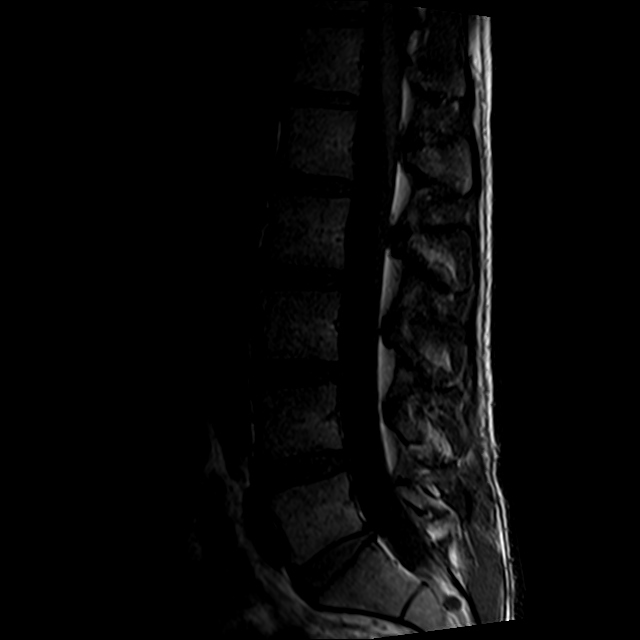
[im 11/15]
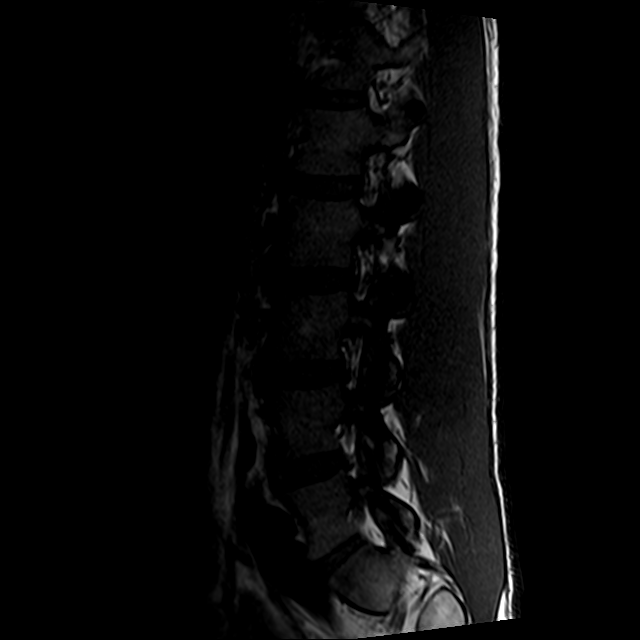
[im 15/15]
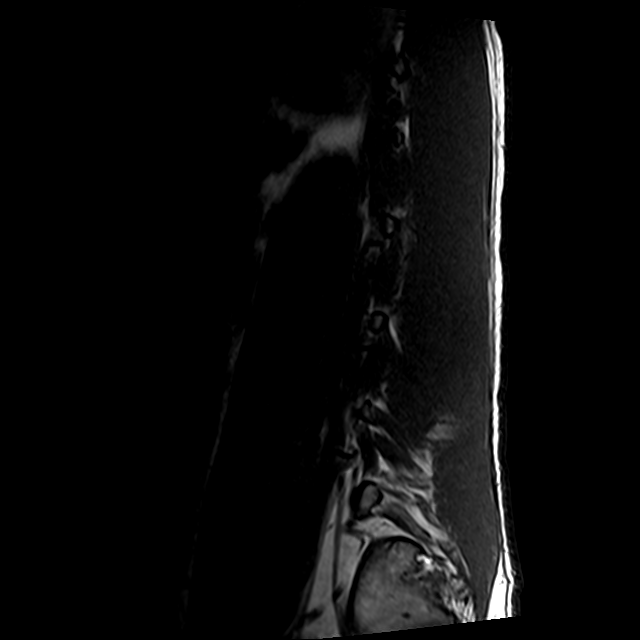

[Series 6: T2 · axial · 4.0mm · 0.78mm/px · z∈[-174,+45]mm · 10 of 42 slices shown (2 of 2)]
[im 3/42]
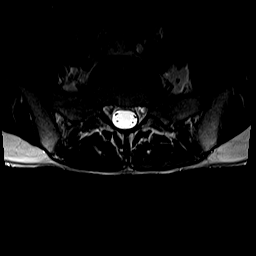
[im 6/42]
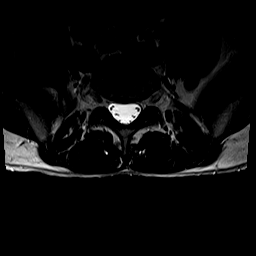
[im 9/42]
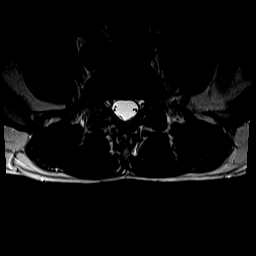
[im 14/42]
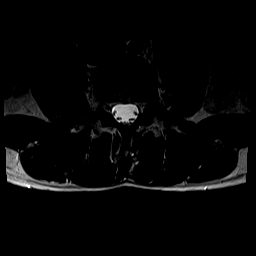
[im 20/42]
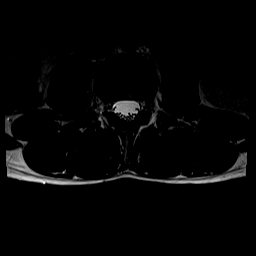
[im 22/42]
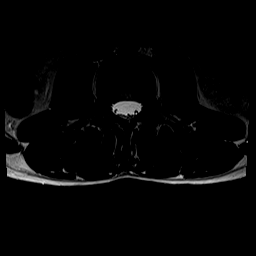
[im 25/42]
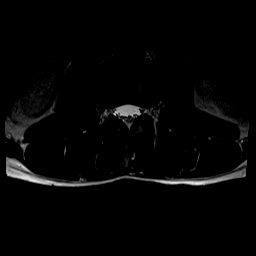
[im 31/42]
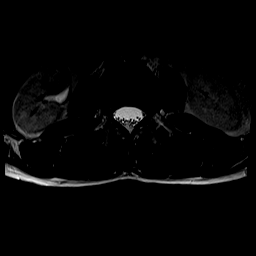
[im 36/42]
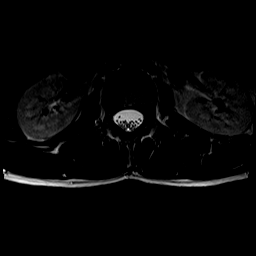
[im 42/42]
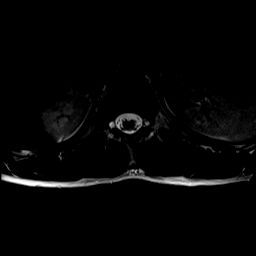

[Series 7: T1 · axial · 4.0mm · 0.39mm/px · z∈[-174,+15]mm · 7 of 42 slices shown (2 of 2)]
[im 3/42]
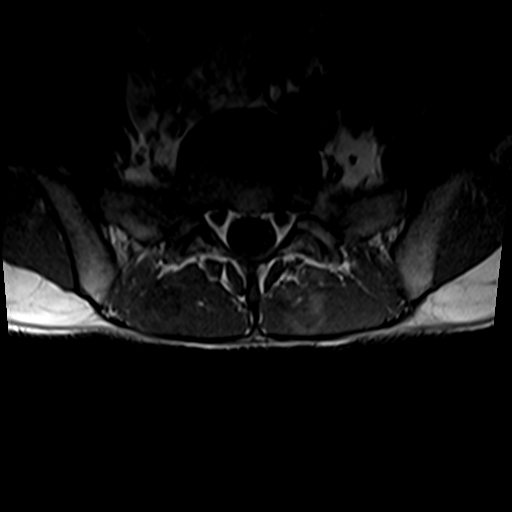
[im 6/42]
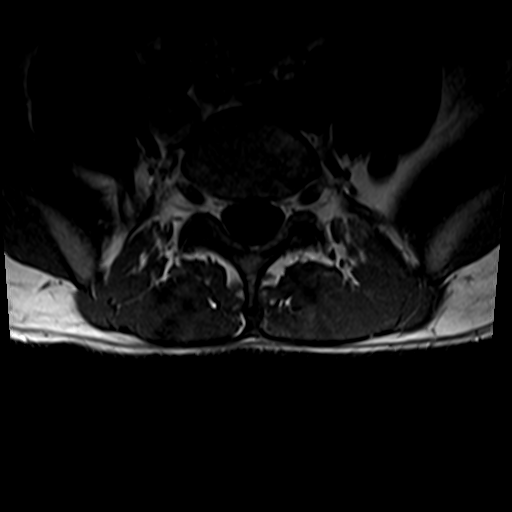
[im 9/42]
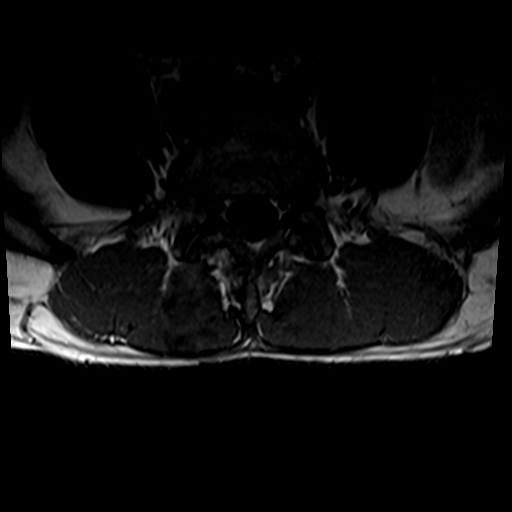
[im 14/42]
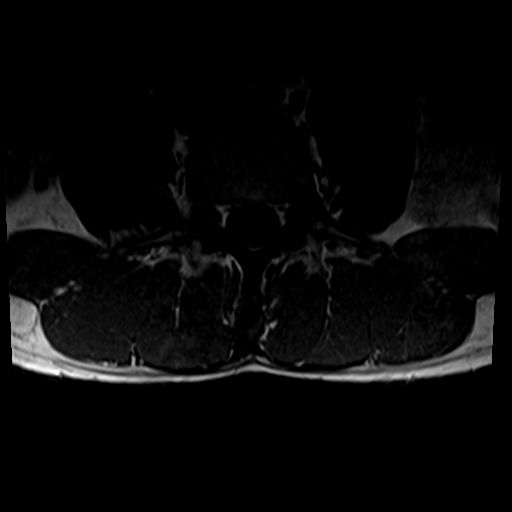
[im 20/42]
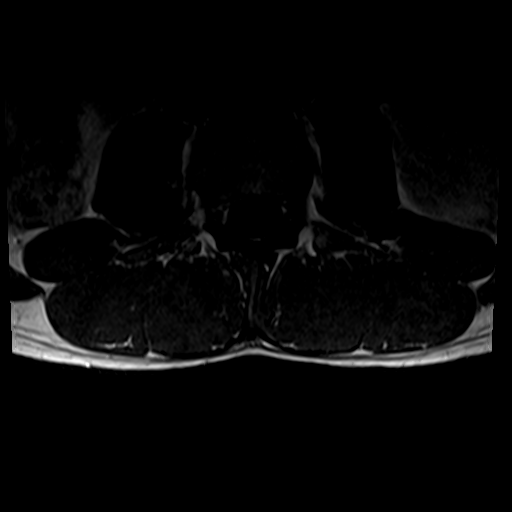
[im 22/42]
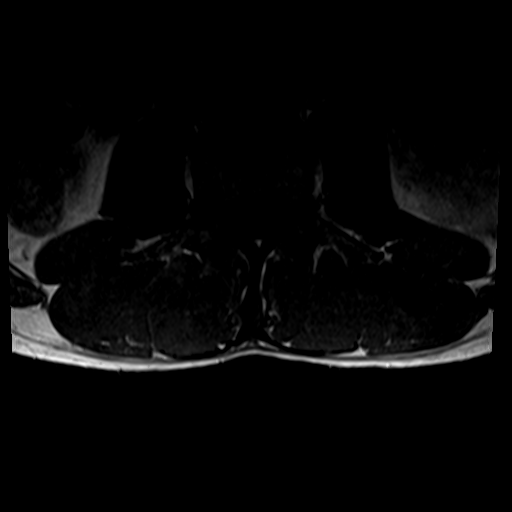
[im 36/42]
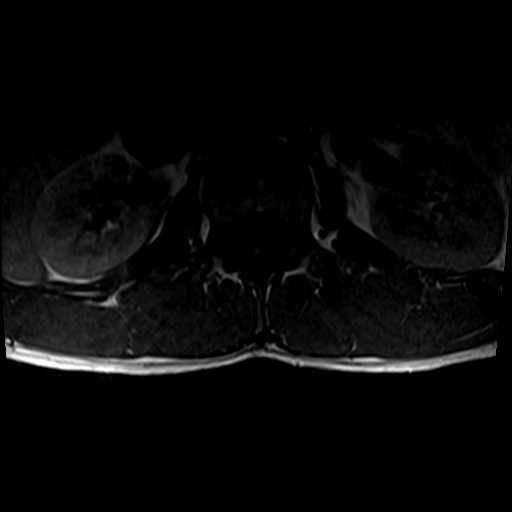

[27 of 48 positions shown; findings below may reference images not displayed]

FINDINGS: Segmentation:  Standard.

Alignment:  Physiologic.

Vertebrae:  No fracture, evidence of discitis, or bone lesion.

Conus medullaris and cauda equina: Conus extends to the L1-2 level.
Conus and cauda equina appear normal.

Paraspinal and other soft tissues: Negative.

Disc levels:

T12-L1: Normal.

L1-2: Normal.

L2-3: Normal.

L3-4: Normal.

L4-5: Normal.

L5-S1: Normal.
IMPRESSION: Normal MRI of the lumbar spine.

## 2019-11-23 IMAGING — DX LUMBAR SPINE - COMPLETE 4+ VIEW
5 series · 5 of 5 positions shown · non-contrast
Comparison: None.

CLINICAL DATA: Lower back popping for 6 months.

EXAM:
LUMBAR SPINE - COMPLETE 4+ VIEW

[l-spine ap]
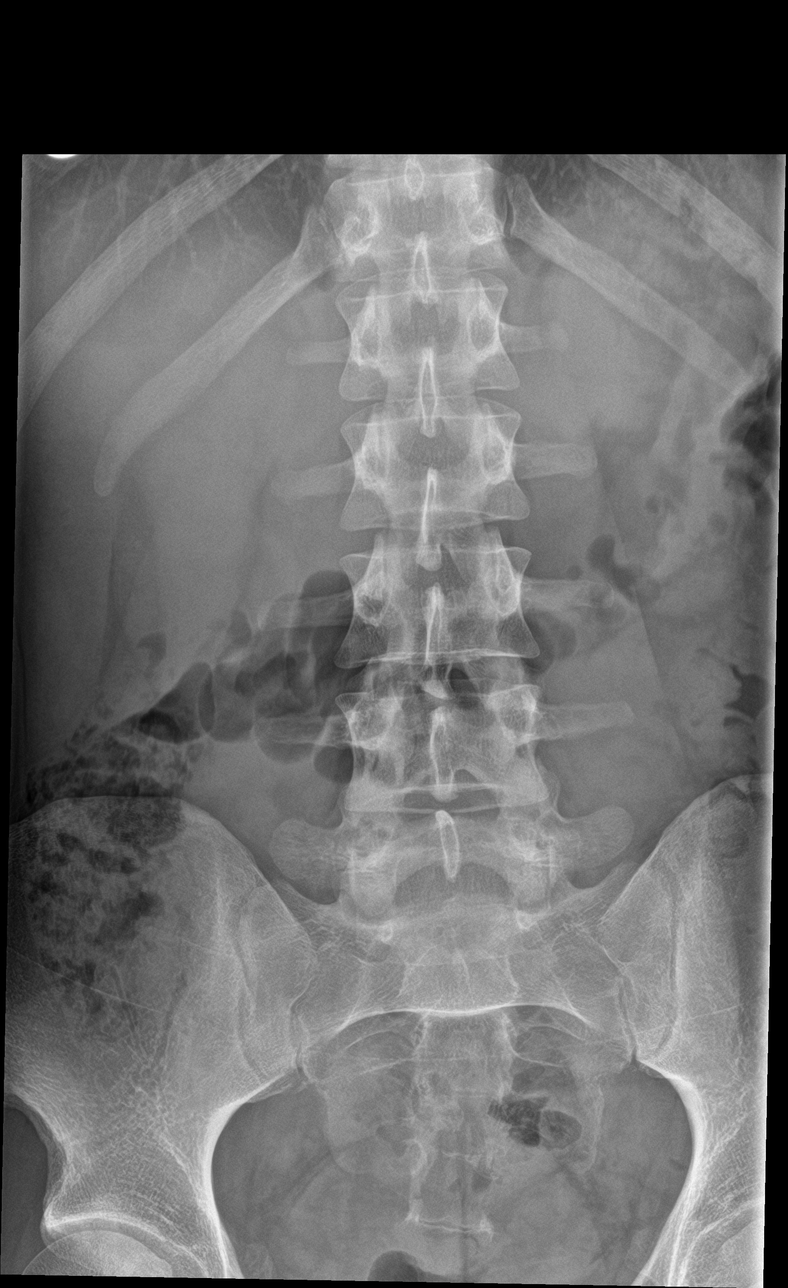

[l-spine obl (1 of 2)]
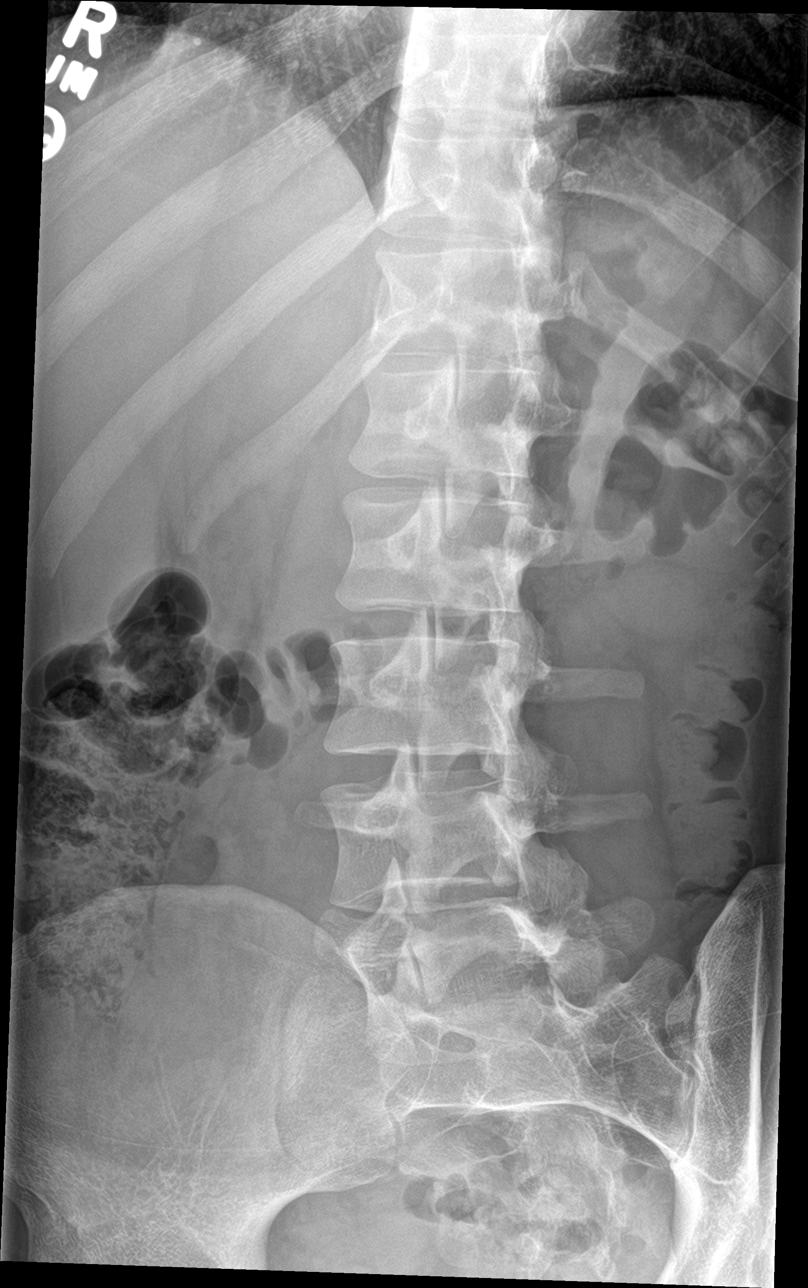

[l-spine obl (2 of 2)]
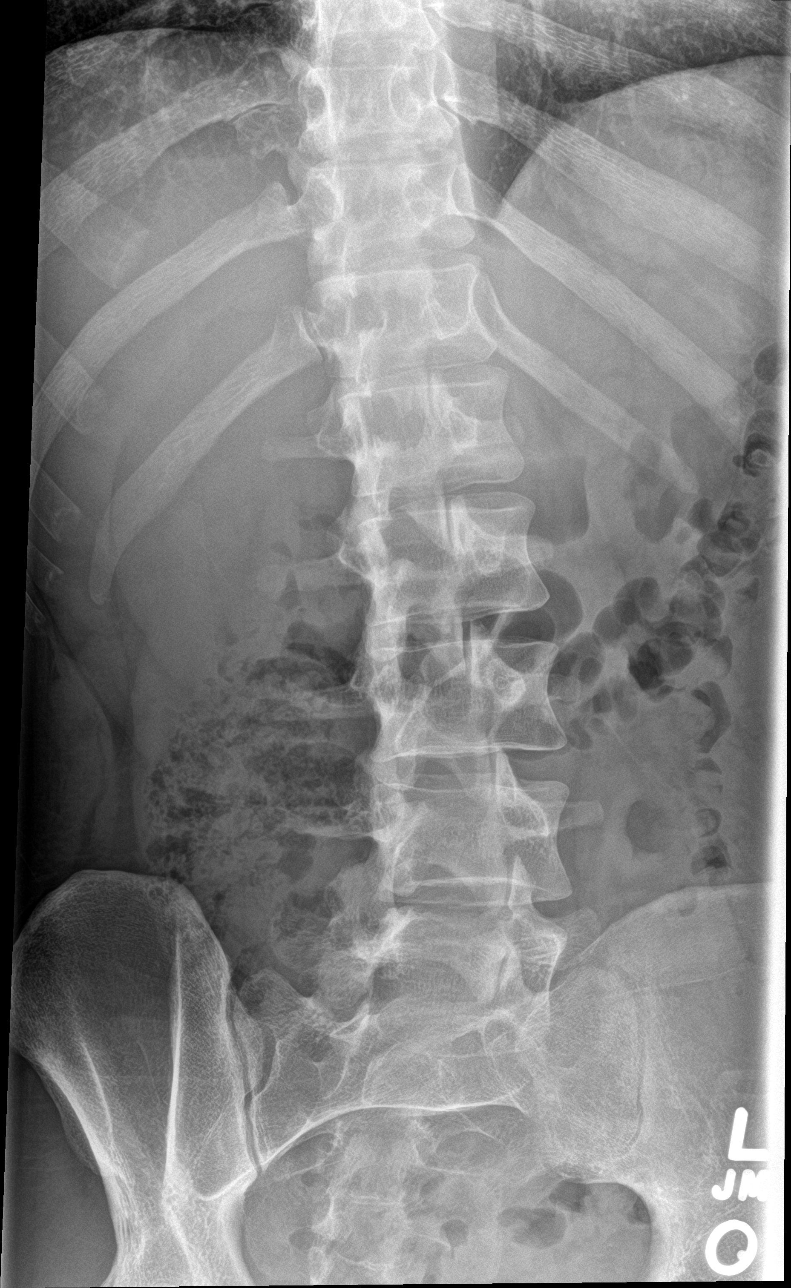

[l-spine lat]
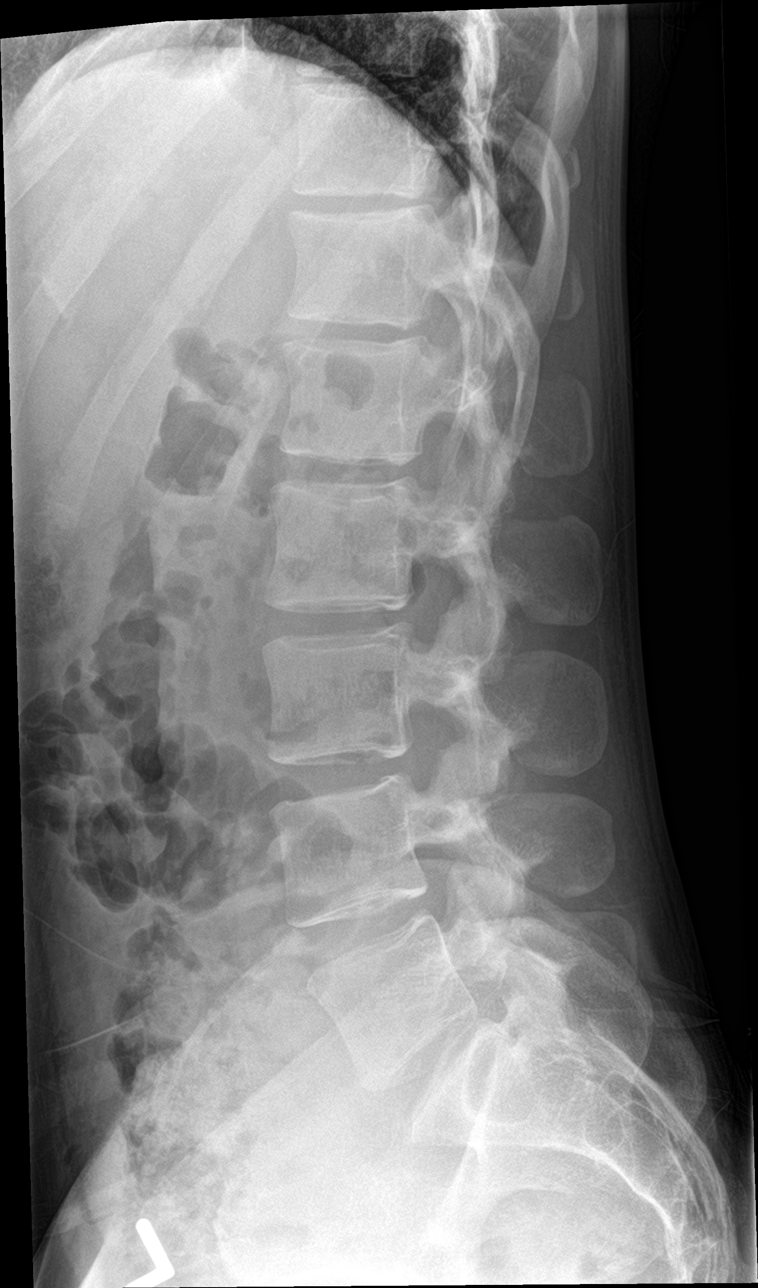

[l-spine spot]
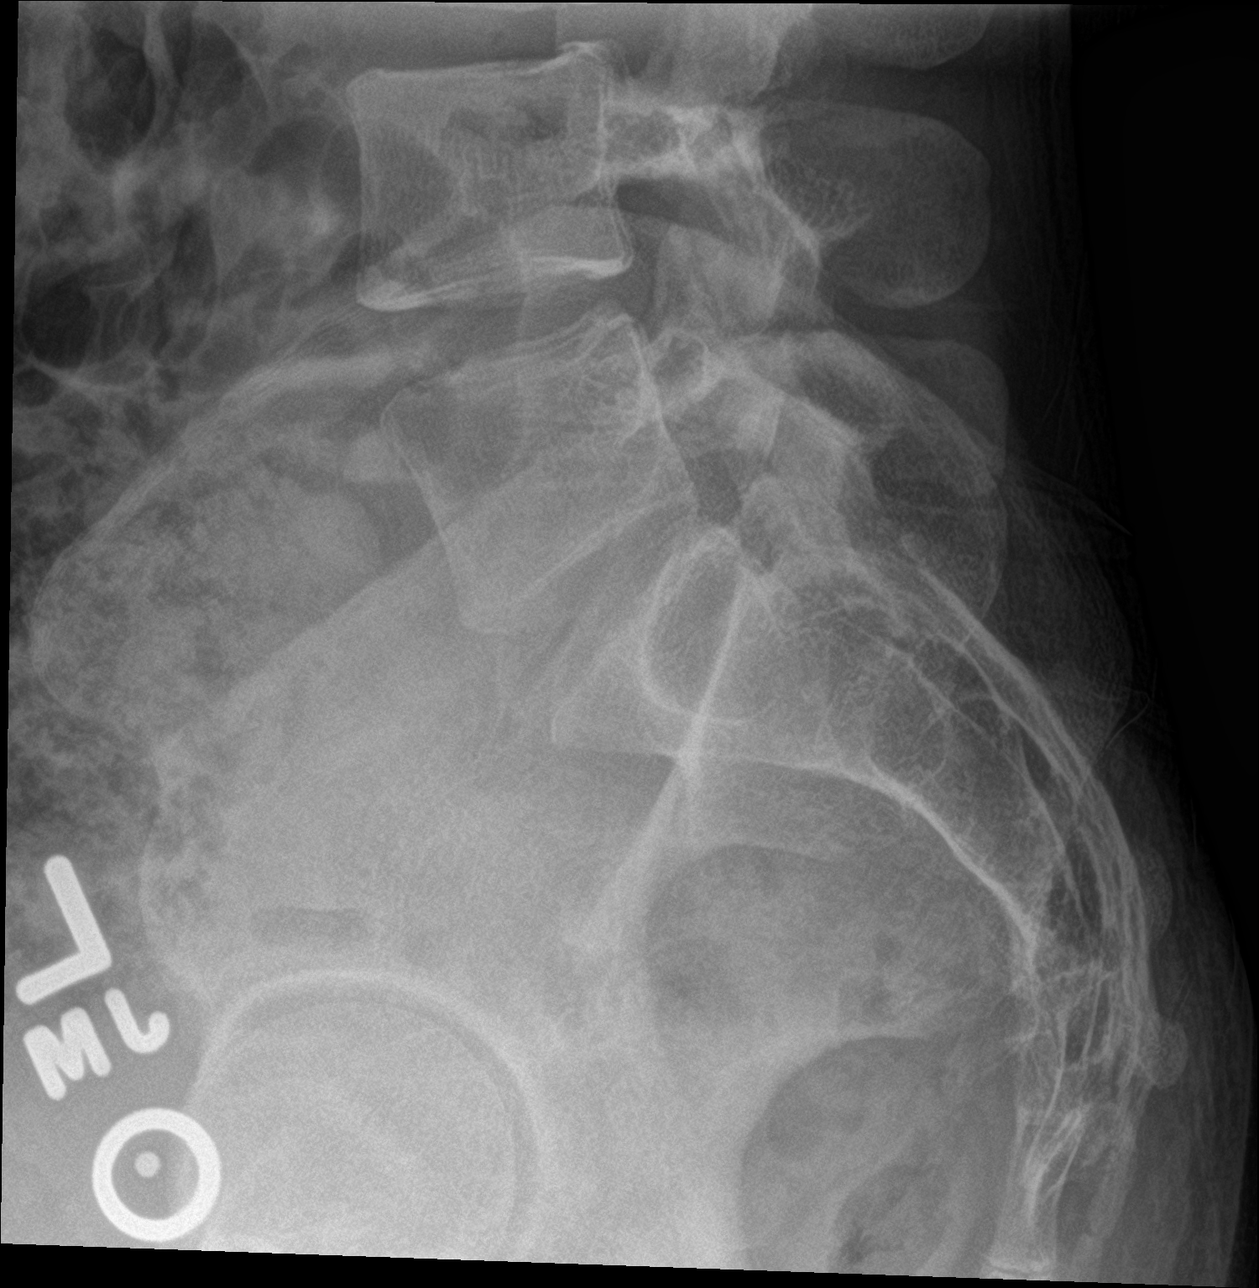

[5 of 5 positions shown; findings below may reference images not displayed]

FINDINGS: There is no evidence of lumbar spine fracture. Alignment is normal.
Intervertebral disc spaces are maintained.
IMPRESSION: Negative.

## 2020-01-12 ENCOUNTER — Other Ambulatory Visit: Payer: Self-pay

## 2020-01-12 ENCOUNTER — Ambulatory Visit (INDEPENDENT_AMBULATORY_CARE_PROVIDER_SITE_OTHER): Payer: BC Managed Care – PPO | Admitting: Sports Medicine

## 2020-01-12 ENCOUNTER — Encounter: Payer: Self-pay | Admitting: Sports Medicine

## 2020-01-12 DIAGNOSIS — Z0000 Encounter for general adult medical examination without abnormal findings: Secondary | ICD-10-CM | POA: Insufficient documentation

## 2020-01-12 DIAGNOSIS — R41840 Attention and concentration deficit: Secondary | ICD-10-CM | POA: Diagnosis not present

## 2020-01-12 NOTE — Assessment & Plan Note (Addendum)
Up-to-date on exam, annual physical as above, ordering routine labs, return in a year. 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 We drew the blood ourselves.

## 2020-01-12 NOTE — Progress Notes (Signed)
Subjective:    CC: Annual Physical Exam  HPI:  This patient is here for their annual physical  I reviewed the past medical history, family history, social history, surgical history, and allergies today and no changes were needed.  Please see the problem list section below in epic for further details.  Past Medical History: No past medical history on file. Past Surgical History: No past surgical history on file. Social History: Social History   Socioeconomic History  . Marital status: Single    Spouse name: Not on file  . Number of children: Not on file  . Years of education: Not on file  . Highest education level: Not on file  Occupational History  . Not on file  Tobacco Use  . Smoking status: Never Smoker  . Smokeless tobacco: Never Used  Substance and Sexual Activity  . Alcohol use: No  . Drug use: No  . Sexual activity: Not Currently  Other Topics Concern  . Not on file  Social History Narrative  . Not on file   Social Determinants of Health   Financial Resource Strain:   . Difficulty of Paying Living Expenses:   Food Insecurity:   . Worried About Charity fundraiser in the Last Year:   . Arboriculturist in the Last Year:   Transportation Needs:   . Film/video editor (Medical):   Marland Kitchen Lack of Transportation (Non-Medical):   Physical Activity:   . Days of Exercise per Week:   . Minutes of Exercise per Session:   Stress:   . Feeling of Stress :   Social Connections:   . Frequency of Communication with Friends and Family:   . Frequency of Social Gatherings with Friends and Family:   . Attends Religious Services:   . Active Member of Clubs or Organizations:   . Attends Archivist Meetings:   Marland Kitchen Marital Status:    Family History: Family History  Problem Relation Age of Onset  . Hypertension Maternal Grandmother   . Hypertension Maternal Grandfather   . Cancer Paternal Grandfather        skin   Allergies: No Known Allergies Medications:  See med rec.  Review of Systems: No headache, visual changes, nausea, vomiting, diarrhea, constipation, dizziness, abdominal pain, skin rash, fevers, chills, night sweats, swollen lymph nodes, weight loss, chest pain, body aches, joint swelling, muscle aches, shortness of breath, mood changes, visual or auditory hallucinations.  Objective:    General: Well Developed, well nourished, and in no acute distress.  Neuro: Alert and oriented x3, extra-ocular muscles intact, sensation grossly intact. Cranial nerves II through XII are intact, motor, sensory, and coordinative functions are all intact. HEENT: Normocephalic, atraumatic, pupils equal round reactive to light, neck supple, no masses, no lymphadenopathy, thyroid nonpalpable. Oropharynx, nasopharynx, external ear canals are unremarkable. Skin: Warm and dry, no rashes noted.  Cardiac: Regular rate and rhythm, no murmurs rubs or gallops.  Respiratory: Clear to auscultation bilaterally. Not using accessory muscles, speaking in full sentences.  Abdominal: Soft, nontender, nondistended, positive bowel sounds, no masses, no organomegaly.  Musculoskeletal: Shoulder, elbow, wrist, hip, knee, ankle stable, and with full range of motion.  I performed venipuncture in the right cephalic vein to obtain labs.  Impression and Recommendations:    The patient was counselled, risk factors were discussed, anticipatory guidance given.  Annual physical exam Up-to-date on exam, annual physical as above, ordering routine labs, return in a year. He was accepted into the PhD program at  [employer]. We drew the blood ourselves.   ___________________________________________ Ihor Austin. Benjamin Stain, M.D., ABFM., CAQSM. Primary Care and Sports Medicine [facility]  Adjunct Professor of Family Medicine  [employer]

## 2020-01-13 ENCOUNTER — Telehealth: Payer: Self-pay | Admitting: *Deleted

## 2020-01-13 LAB — COMPLETE METABOLIC PANEL WITH GFR
AG Ratio: 1.8 (calc) (ref 1.0–2.5)
ALT: 19 U/L (ref 9–46)
AST: 25 U/L (ref 10–40)
Albumin: 4.8 g/dL (ref 3.6–5.1)
Alkaline phosphatase (APISO): 68 U/L (ref 36–130)
BUN: 18 mg/dL (ref 7–25)
CO2: 30 mmol/L (ref 20–32)
Calcium: 9.6 mg/dL (ref 8.6–10.3)
Chloride: 102 mmol/L (ref 98–110)
Creat: 1.15 mg/dL (ref 0.60–1.35)
GFR, Est African American: 102 mL/min/{1.73_m2} (ref >=60)
GFR, Est Non African American: 88 mL/min/{1.73_m2} (ref >=60)
Globulin: 2.6 g/dL (calc) (ref 1.9–3.7)
Glucose, Bld: 98 mg/dL (ref 65–99)
Potassium: 4.2 mmol/L (ref 3.5–5.3)
Sodium: 140 mmol/L (ref 135–146)
Total Bilirubin: 1.1 mg/dL (ref 0.2–1.2)
Total Protein: 7.4 g/dL (ref 6.1–8.1)

## 2020-01-13 LAB — TSH: TSH: 2.12 mIU/L (ref 0.40–4.50)

## 2020-01-13 LAB — LIPID PANEL W/REFLEX DIRECT LDL
Cholesterol: 182 mg/dL (ref <200)
HDL: 64 mg/dL (ref >=40)
LDL Cholesterol (Calc): 90 mg/dL (calc)
Non-HDL Cholesterol (Calc): 118 mg/dL (calc) (ref <130)
Total CHOL/HDL Ratio: 2.8 (calc) (ref <5.0)
Triglycerides: 185 mg/dL — ABNORMAL HIGH (ref <150)

## 2020-01-13 LAB — CBC
HCT: 47.3 % (ref 38.5–50.0)
Hemoglobin: 15.7 g/dL (ref 13.2–17.1)
MCH: 32.1 pg (ref 27.0–33.0)
MCHC: 33.2 g/dL (ref 32.0–36.0)
MCV: 96.7 fL (ref 80.0–100.0)
MPV: 10.5 fL (ref 7.5–12.5)
Platelets: 207 10*3/uL (ref 140–400)
RBC: 4.89 10*6/uL (ref 4.20–5.80)
RDW: 11.7 % (ref 11.0–15.0)
WBC: 5.6 10*3/uL (ref 3.8–10.8)

## 2020-01-13 MED ORDER — ACETAZOLAMIDE 125 MG PO TABS
ORAL_TABLET | ORAL | 3 refills | Status: DC
Start: 2020-01-13 — End: 2020-01-14

## 2020-01-13 MED ORDER — TRIAMCINOLONE ACETONIDE 0.5 % EX CREA
1.0000 | TOPICAL_CREAM | Freq: Two times a day (BID) | CUTANEOUS | 3 refills | Status: DC
Start: 2020-01-13 — End: 2020-01-14

## 2020-01-13 NOTE — Telephone Encounter (Signed)
Pt left vm stating that you were supposed to send over a steroid cream yesterday for him to take with him to [place].  He also wanted to know if there is something he can take to prevent altitude sickness.  Please advise.

## 2020-01-13 NOTE — Telephone Encounter (Signed)
Pt notified of medications.

## 2020-01-13 NOTE — Telephone Encounter (Signed)
I sent both the cream and acetazolamide for altitude sickness prevention, he really should not need it unless he is planning to ascend higher than 12,000 feet.

## 2020-01-14 ENCOUNTER — Other Ambulatory Visit: Payer: Self-pay | Admitting: *Deleted

## 2020-01-14 MED ORDER — ACETAZOLAMIDE 125 MG PO TABS: ORAL_TABLET | ORAL | 3 refills | Status: AC

## 2020-01-14 MED ORDER — TRIAMCINOLONE ACETONIDE 0.5 % EX CREA: 1.0000 "application " | TOPICAL_CREAM | Freq: Two times a day (BID) | CUTANEOUS | 3 refills | Status: AC
